# Patient Record
Sex: Male | Born: 1980 | Race: Black or African American | Hispanic: No | Marital: Single | Smoking: Never smoker
Health system: Southern US, Community
[De-identification: ages and names within clinical notes are randomized; demographics above are authoritative.]

## PROBLEM LIST (undated history)

## (undated) DIAGNOSIS — Z21 Asymptomatic human immunodeficiency virus [HIV] infection status: Secondary | ICD-10-CM

## (undated) DIAGNOSIS — B20 Human immunodeficiency virus [HIV] disease: Secondary | ICD-10-CM

---

## 2001-07-24 ENCOUNTER — Emergency Department (HOSPITAL_COMMUNITY): Admission: EM | Admit: 2001-07-24 | Discharge: 2001-07-25 | Payer: Self-pay | Admitting: *Deleted

## 2001-07-29 ENCOUNTER — Emergency Department (HOSPITAL_COMMUNITY): Admission: EM | Admit: 2001-07-29 | Discharge: 2001-07-30 | Payer: Self-pay | Admitting: Emergency Medicine

## 2001-07-30 ENCOUNTER — Ambulatory Visit (HOSPITAL_COMMUNITY): Admission: RE | Admit: 2001-07-30 | Discharge: 2001-07-30 | Payer: Self-pay | Admitting: *Deleted

## 2002-03-27 ENCOUNTER — Encounter (INDEPENDENT_AMBULATORY_CARE_PROVIDER_SITE_OTHER): Payer: Self-pay | Admitting: *Deleted

## 2002-03-27 LAB — CONVERTED CEMR LAB
CD4 Count: 280 microliters
CD4 T Cell Abs: 280

## 2002-09-13 ENCOUNTER — Encounter: Admission: RE | Admit: 2002-09-13 | Discharge: 2002-09-13 | Payer: Self-pay | Admitting: Infectious Diseases

## 2002-09-13 ENCOUNTER — Ambulatory Visit (HOSPITAL_COMMUNITY): Admission: RE | Admit: 2002-09-13 | Discharge: 2002-09-13 | Payer: Self-pay | Admitting: Infectious Diseases

## 2002-11-15 ENCOUNTER — Encounter: Admission: RE | Admit: 2002-11-15 | Discharge: 2002-11-15 | Payer: Self-pay | Admitting: Infectious Diseases

## 2003-04-23 ENCOUNTER — Encounter (INDEPENDENT_AMBULATORY_CARE_PROVIDER_SITE_OTHER): Payer: Self-pay | Admitting: Infectious Diseases

## 2003-04-23 ENCOUNTER — Encounter: Admission: RE | Admit: 2003-04-23 | Discharge: 2003-04-23 | Payer: Self-pay | Admitting: Infectious Diseases

## 2003-04-23 ENCOUNTER — Ambulatory Visit (HOSPITAL_COMMUNITY): Admission: RE | Admit: 2003-04-23 | Discharge: 2003-04-23 | Payer: Self-pay | Admitting: Infectious Diseases

## 2003-05-07 ENCOUNTER — Encounter: Admission: RE | Admit: 2003-05-07 | Discharge: 2003-05-07 | Payer: Self-pay | Admitting: Infectious Diseases

## 2003-09-06 ENCOUNTER — Encounter (INDEPENDENT_AMBULATORY_CARE_PROVIDER_SITE_OTHER): Payer: Self-pay | Admitting: Infectious Diseases

## 2003-09-06 ENCOUNTER — Encounter: Admission: RE | Admit: 2003-09-06 | Discharge: 2003-09-06 | Payer: Self-pay | Admitting: Infectious Diseases

## 2003-09-06 ENCOUNTER — Ambulatory Visit (HOSPITAL_COMMUNITY): Admission: RE | Admit: 2003-09-06 | Discharge: 2003-09-06 | Payer: Self-pay | Admitting: Infectious Diseases

## 2003-09-19 ENCOUNTER — Encounter: Admission: RE | Admit: 2003-09-19 | Discharge: 2003-09-19 | Payer: Self-pay | Admitting: Infectious Diseases

## 2004-01-21 ENCOUNTER — Ambulatory Visit (HOSPITAL_COMMUNITY): Admission: RE | Admit: 2004-01-21 | Discharge: 2004-01-21 | Payer: Self-pay | Admitting: Infectious Diseases

## 2004-01-21 ENCOUNTER — Encounter: Admission: RE | Admit: 2004-01-21 | Discharge: 2004-01-21 | Payer: Self-pay | Admitting: Infectious Diseases

## 2004-02-04 ENCOUNTER — Encounter: Admission: RE | Admit: 2004-02-04 | Discharge: 2004-02-04 | Payer: Self-pay | Admitting: Infectious Diseases

## 2004-08-25 ENCOUNTER — Ambulatory Visit: Payer: Self-pay | Admitting: Infectious Diseases

## 2004-08-25 ENCOUNTER — Ambulatory Visit (HOSPITAL_COMMUNITY): Admission: RE | Admit: 2004-08-25 | Discharge: 2004-08-25 | Payer: Self-pay | Admitting: Infectious Diseases

## 2004-09-08 ENCOUNTER — Ambulatory Visit: Payer: Self-pay | Admitting: Infectious Diseases

## 2004-10-13 ENCOUNTER — Ambulatory Visit: Payer: Self-pay | Admitting: Infectious Diseases

## 2004-10-13 ENCOUNTER — Ambulatory Visit (HOSPITAL_COMMUNITY): Admission: RE | Admit: 2004-10-13 | Discharge: 2004-10-13 | Payer: Self-pay | Admitting: Infectious Diseases

## 2004-11-03 ENCOUNTER — Ambulatory Visit: Payer: Self-pay | Admitting: Infectious Diseases

## 2005-03-04 ENCOUNTER — Ambulatory Visit: Payer: Self-pay | Admitting: Infectious Diseases

## 2005-03-04 ENCOUNTER — Ambulatory Visit (HOSPITAL_COMMUNITY): Admission: RE | Admit: 2005-03-04 | Discharge: 2005-03-04 | Payer: Self-pay | Admitting: Infectious Diseases

## 2005-03-23 ENCOUNTER — Ambulatory Visit: Payer: Self-pay | Admitting: Infectious Diseases

## 2006-02-22 ENCOUNTER — Ambulatory Visit: Payer: Self-pay | Admitting: Infectious Diseases

## 2006-02-22 ENCOUNTER — Encounter: Payer: Self-pay | Admitting: Infectious Disease

## 2006-02-22 ENCOUNTER — Encounter: Admission: RE | Admit: 2006-02-22 | Discharge: 2006-02-22 | Payer: Self-pay | Admitting: Infectious Diseases

## 2006-02-22 LAB — CONVERTED CEMR LAB
CD4 Count: 200 microliters
HIV 1 RNA Quant: 4370 copies/mL

## 2006-03-04 ENCOUNTER — Ambulatory Visit: Payer: Self-pay | Admitting: Infectious Diseases

## 2006-03-22 ENCOUNTER — Ambulatory Visit: Payer: Self-pay | Admitting: Infectious Diseases

## 2006-03-22 ENCOUNTER — Encounter (INDEPENDENT_AMBULATORY_CARE_PROVIDER_SITE_OTHER): Payer: Self-pay | Admitting: *Deleted

## 2006-03-22 ENCOUNTER — Encounter: Admission: RE | Admit: 2006-03-22 | Discharge: 2006-03-22 | Payer: Self-pay | Admitting: Infectious Diseases

## 2006-03-22 LAB — CONVERTED CEMR LAB
CD4 Count: 240 microliters
HIV 1 RNA Quant: 18300 copies/mL

## 2006-07-08 ENCOUNTER — Ambulatory Visit: Payer: Self-pay | Admitting: Infectious Diseases

## 2006-07-08 ENCOUNTER — Encounter (INDEPENDENT_AMBULATORY_CARE_PROVIDER_SITE_OTHER): Payer: Self-pay | Admitting: *Deleted

## 2006-07-08 ENCOUNTER — Encounter: Admission: RE | Admit: 2006-07-08 | Discharge: 2006-07-08 | Payer: Self-pay | Admitting: Infectious Diseases

## 2006-07-08 LAB — CONVERTED CEMR LAB
CD4 Count: 330 microliters
HIV 1 RNA Quant: 4480 copies/mL

## 2006-07-09 DIAGNOSIS — B20 Human immunodeficiency virus [HIV] disease: Secondary | ICD-10-CM | POA: Insufficient documentation

## 2006-07-22 ENCOUNTER — Ambulatory Visit: Payer: Self-pay | Admitting: Infectious Diseases

## 2006-11-08 ENCOUNTER — Encounter (INDEPENDENT_AMBULATORY_CARE_PROVIDER_SITE_OTHER): Payer: Self-pay | Admitting: *Deleted

## 2006-11-08 ENCOUNTER — Encounter: Admission: RE | Admit: 2006-11-08 | Discharge: 2006-11-08 | Payer: Self-pay | Admitting: Infectious Diseases

## 2006-11-08 ENCOUNTER — Ambulatory Visit: Payer: Self-pay | Admitting: Infectious Diseases

## 2006-11-08 LAB — CONVERTED CEMR LAB
CD4 Count: 340 microliters
HIV 1 RNA Quant: 8460 copies/mL

## 2006-11-29 ENCOUNTER — Encounter (INDEPENDENT_AMBULATORY_CARE_PROVIDER_SITE_OTHER): Payer: Self-pay | Admitting: *Deleted

## 2006-11-29 LAB — CONVERTED CEMR LAB

## 2006-12-12 ENCOUNTER — Encounter (INDEPENDENT_AMBULATORY_CARE_PROVIDER_SITE_OTHER): Payer: Self-pay | Admitting: *Deleted

## 2006-12-20 ENCOUNTER — Ambulatory Visit: Payer: Self-pay | Admitting: Infectious Diseases

## 2006-12-20 DIAGNOSIS — F329 Major depressive disorder, single episode, unspecified: Secondary | ICD-10-CM

## 2007-04-25 ENCOUNTER — Other Ambulatory Visit (HOSPITAL_COMMUNITY): Admission: RE | Admit: 2007-04-25 | Discharge: 2007-07-24 | Payer: Self-pay | Admitting: Psychiatry

## 2007-04-28 ENCOUNTER — Ambulatory Visit: Payer: Self-pay | Admitting: Psychiatry

## 2007-05-16 ENCOUNTER — Ambulatory Visit: Payer: Self-pay | Admitting: Infectious Disease

## 2007-05-16 ENCOUNTER — Encounter (INDEPENDENT_AMBULATORY_CARE_PROVIDER_SITE_OTHER): Payer: Self-pay | Admitting: *Deleted

## 2007-05-16 ENCOUNTER — Encounter: Admission: RE | Admit: 2007-05-16 | Discharge: 2007-05-16 | Payer: Self-pay | Admitting: Infectious Disease

## 2007-05-16 LAB — CONVERTED CEMR LAB
BUN: 12 mg/dL (ref 6–23)
CO2: 25 meq/L (ref 19–32)
Creatinine, Ser: 1.2 mg/dL (ref 0.40–1.50)
Eosinophils Relative: 4 % (ref 0–5)
Glucose, Bld: 78 mg/dL (ref 70–99)
HCT: 42.3 % (ref 39.0–52.0)
HIV 1 RNA Quant: 5340 copies/mL — ABNORMAL HIGH (ref <50)
HIV-1 RNA Quant, Log: 3.73 — ABNORMAL HIGH (ref <1.70)
Hemoglobin: 14.4 g/dL (ref 13.0–17.0)
Lymphocytes Relative: 51 % — ABNORMAL HIGH (ref 12–46)
Lymphs Abs: 1 10*3/uL (ref 0.7–3.3)
Monocytes Absolute: 0.3 10*3/uL (ref 0.2–0.7)
Total Bilirubin: 0.5 mg/dL (ref 0.3–1.2)
WBC: 1.9 10*3/uL — ABNORMAL LOW (ref 4.0–10.5)

## 2007-05-30 ENCOUNTER — Encounter (INDEPENDENT_AMBULATORY_CARE_PROVIDER_SITE_OTHER): Payer: Self-pay | Admitting: *Deleted

## 2007-05-30 ENCOUNTER — Ambulatory Visit: Payer: Self-pay | Admitting: Infectious Disease

## 2007-05-30 DIAGNOSIS — B002 Herpesviral gingivostomatitis and pharyngotonsillitis: Secondary | ICD-10-CM | POA: Insufficient documentation

## 2007-05-30 DIAGNOSIS — B957 Other staphylococcus as the cause of diseases classified elsewhere: Secondary | ICD-10-CM | POA: Insufficient documentation

## 2007-06-14 ENCOUNTER — Telehealth: Payer: Self-pay | Admitting: Infectious Disease

## 2007-08-26 ENCOUNTER — Ambulatory Visit: Payer: Self-pay | Admitting: Infectious Disease

## 2007-08-26 ENCOUNTER — Encounter: Admission: RE | Admit: 2007-08-26 | Discharge: 2007-08-26 | Payer: Self-pay | Admitting: Infectious Disease

## 2007-08-26 LAB — CONVERTED CEMR LAB
AST: 32 units/L (ref 0–37)
Albumin: 4.5 g/dL (ref 3.5–5.2)
BUN: 13 mg/dL (ref 6–23)
Basophils Relative: 0 % (ref 0–1)
Calcium: 9.6 mg/dL (ref 8.4–10.5)
Chloride: 102 meq/L (ref 96–112)
Lymphs Abs: 1.3 10*3/uL (ref 0.7–4.0)
MCHC: 35.3 g/dL (ref 30.0–36.0)
Monocytes Relative: 14 % — ABNORMAL HIGH (ref 3–12)
Neutro Abs: 0.6 10*3/uL — ABNORMAL LOW (ref 1.7–7.7)
Neutrophils Relative %: 26 % — ABNORMAL LOW (ref 43–77)
Potassium: 4.1 meq/L (ref 3.5–5.3)
RBC: 4.66 M/uL (ref 4.22–5.81)
WBC: 2.3 10*3/uL — ABNORMAL LOW (ref 4.0–10.5)

## 2007-09-19 ENCOUNTER — Ambulatory Visit: Payer: Self-pay | Admitting: Infectious Disease

## 2007-09-19 LAB — CONVERTED CEMR LAB

## 2007-10-04 ENCOUNTER — Encounter (INDEPENDENT_AMBULATORY_CARE_PROVIDER_SITE_OTHER): Payer: Self-pay | Admitting: *Deleted

## 2007-10-13 ENCOUNTER — Telehealth: Payer: Self-pay | Admitting: Infectious Disease

## 2007-11-22 ENCOUNTER — Encounter: Admission: RE | Admit: 2007-11-22 | Discharge: 2007-11-22 | Payer: Self-pay | Admitting: Infectious Disease

## 2007-11-22 ENCOUNTER — Ambulatory Visit: Payer: Self-pay | Admitting: Infectious Disease

## 2007-11-22 LAB — CONVERTED CEMR LAB
ALT: 35 units/L (ref 0–53)
Alkaline Phosphatase: 110 units/L (ref 39–117)
Basophils Absolute: 0 10*3/uL (ref 0.0–0.1)
Basophils Relative: 1 % (ref 0–1)
Eosinophils Absolute: 0.1 10*3/uL (ref 0.0–0.7)
Eosinophils Relative: 5 % (ref 0–5)
HCT: 44.1 % (ref 39.0–52.0)
Lymphocytes Relative: 49 % — ABNORMAL HIGH (ref 12–46)
MCHC: 34 g/dL (ref 30.0–36.0)
MCV: 92.8 fL (ref 78.0–100.0)
Platelets: 225 10*3/uL (ref 150–400)
RDW: 13.7 % (ref 11.5–15.5)
Sodium: 139 meq/L (ref 135–145)
Total Bilirubin: 0.5 mg/dL (ref 0.3–1.2)
Total Protein: 8.5 g/dL — ABNORMAL HIGH (ref 6.0–8.3)

## 2007-12-12 ENCOUNTER — Ambulatory Visit: Payer: Self-pay | Admitting: Infectious Disease

## 2007-12-12 DIAGNOSIS — L988 Other specified disorders of the skin and subcutaneous tissue: Secondary | ICD-10-CM | POA: Insufficient documentation

## 2007-12-12 DIAGNOSIS — B354 Tinea corporis: Secondary | ICD-10-CM | POA: Insufficient documentation

## 2008-04-09 ENCOUNTER — Telehealth: Payer: Self-pay | Admitting: Infectious Disease

## 2008-04-09 ENCOUNTER — Encounter (INDEPENDENT_AMBULATORY_CARE_PROVIDER_SITE_OTHER): Payer: Self-pay | Admitting: *Deleted

## 2008-04-13 ENCOUNTER — Ambulatory Visit: Payer: Self-pay | Admitting: Infectious Disease

## 2008-04-13 ENCOUNTER — Encounter: Admission: RE | Admit: 2008-04-13 | Discharge: 2008-04-13 | Payer: Self-pay | Admitting: Infectious Disease

## 2008-04-13 LAB — CONVERTED CEMR LAB
AST: 20 units/L (ref 0–37)
Albumin: 4.2 g/dL (ref 3.5–5.2)
Alkaline Phosphatase: 109 units/L (ref 39–117)
Chloride: 107 meq/L (ref 96–112)
Eosinophils Absolute: 0.2 10*3/uL (ref 0.0–0.7)
Eosinophils Relative: 8 % — ABNORMAL HIGH (ref 0–5)
Glucose, Bld: 102 mg/dL — ABNORMAL HIGH (ref 70–99)
HCT: 44.3 % (ref 39.0–52.0)
HIV 1 RNA Quant: 106 copies/mL — ABNORMAL HIGH (ref <50)
Hemoglobin: 15.2 g/dL (ref 13.0–17.0)
Lymphocytes Relative: 48 % — ABNORMAL HIGH (ref 12–46)
MCHC: 34.3 g/dL (ref 30.0–36.0)
Monocytes Absolute: 0.2 10*3/uL (ref 0.1–1.0)
Monocytes Relative: 10 % (ref 3–12)
Potassium: 4.3 meq/L (ref 3.5–5.3)
RBC: 4.63 M/uL (ref 4.22–5.81)
RDW: 13.1 % (ref 11.5–15.5)
Sodium: 140 meq/L (ref 135–145)
Total Protein: 7.8 g/dL (ref 6.0–8.3)

## 2008-04-18 ENCOUNTER — Encounter (INDEPENDENT_AMBULATORY_CARE_PROVIDER_SITE_OTHER): Payer: Self-pay | Admitting: *Deleted

## 2008-04-23 ENCOUNTER — Ambulatory Visit: Payer: Self-pay | Admitting: Infectious Disease

## 2008-04-23 DIAGNOSIS — R1110 Vomiting, unspecified: Secondary | ICD-10-CM

## 2008-04-23 LAB — CONVERTED CEMR LAB
AST: 28 units/L (ref 0–37)
Albumin: 4.4 g/dL (ref 3.5–5.2)
Alkaline Phosphatase: 117 units/L (ref 39–117)
BUN: 14 mg/dL (ref 6–23)
Basophils Relative: 0 % (ref 0–1)
Bilirubin Urine: NEGATIVE
Creatinine, Ser: 1.06 mg/dL (ref 0.40–1.50)
Eosinophils Absolute: 0.2 10*3/uL (ref 0.0–0.7)
Eosinophils Relative: 6 % — ABNORMAL HIGH (ref 0–5)
Glucose, Bld: 61 mg/dL — ABNORMAL LOW (ref 70–99)
HCT: 45.8 % (ref 39.0–52.0)
Hemoglobin: 16 g/dL (ref 13.0–17.0)
Lymphs Abs: 1.3 10*3/uL (ref 0.7–4.0)
MCHC: 34.9 g/dL (ref 30.0–36.0)
MCV: 94.4 fL (ref 78.0–100.0)
Monocytes Absolute: 0.3 10*3/uL (ref 0.1–1.0)
Monocytes Relative: 11 % (ref 3–12)
Neutrophils Relative %: 35 % — ABNORMAL LOW (ref 43–77)
Nitrite: NEGATIVE
Potassium: 4.3 meq/L (ref 3.5–5.3)
RBC: 4.85 M/uL (ref 4.22–5.81)
Specific Gravity, Urine: 1.034 — ABNORMAL HIGH (ref 1.005–1.03)
Urobilinogen, UA: 1 (ref 0.0–1.0)
WBC: 2.8 10*3/uL — ABNORMAL LOW (ref 4.0–10.5)
pH: 6 (ref 5.0–8.0)

## 2008-04-25 ENCOUNTER — Ambulatory Visit (HOSPITAL_COMMUNITY): Admission: RE | Admit: 2008-04-25 | Discharge: 2008-04-25 | Payer: Self-pay | Admitting: Infectious Disease

## 2008-05-18 ENCOUNTER — Encounter: Payer: Self-pay | Admitting: Infectious Disease

## 2008-06-06 ENCOUNTER — Ambulatory Visit: Payer: Self-pay | Admitting: Infectious Disease

## 2008-06-06 ENCOUNTER — Encounter (INDEPENDENT_AMBULATORY_CARE_PROVIDER_SITE_OTHER): Payer: Self-pay | Admitting: *Deleted

## 2008-06-06 LAB — CONVERTED CEMR LAB
ALT: 32 units/L (ref 0–53)
Albumin: 4.4 g/dL (ref 3.5–5.2)
Basophils Absolute: 0 10*3/uL (ref 0.0–0.1)
CO2: 24 meq/L (ref 19–32)
Calcium: 9 mg/dL (ref 8.4–10.5)
Chloride: 107 meq/L (ref 96–112)
Cholesterol: 195 mg/dL (ref 0–200)
HIV-1 RNA Quant, Log: 2.05 — ABNORMAL HIGH (ref <1.70)
Hemoglobin: 15.2 g/dL (ref 13.0–17.0)
Lymphocytes Relative: 54 % — ABNORMAL HIGH (ref 12–46)
Lymphs Abs: 1.5 10*3/uL (ref 0.7–4.0)
Neutro Abs: 0.9 10*3/uL — ABNORMAL LOW (ref 1.7–7.7)
Platelets: 243 10*3/uL (ref 150–400)
Potassium: 3.8 meq/L (ref 3.5–5.3)
RDW: 13.5 % (ref 11.5–15.5)
Sodium: 140 meq/L (ref 135–145)
Total Protein: 8 g/dL (ref 6.0–8.3)
VLDL: 27 mg/dL (ref 0–40)
WBC: 2.9 10*3/uL — ABNORMAL LOW (ref 4.0–10.5)

## 2008-07-04 ENCOUNTER — Ambulatory Visit: Payer: Self-pay | Admitting: Infectious Disease

## 2008-08-16 ENCOUNTER — Ambulatory Visit: Payer: Self-pay | Admitting: Infectious Disease

## 2008-08-16 DIAGNOSIS — I10 Essential (primary) hypertension: Secondary | ICD-10-CM | POA: Insufficient documentation

## 2008-08-16 LAB — CONVERTED CEMR LAB
Creatinine, Urine: 237.6 mg/dL
Microalb Creat Ratio: 2.4 mg/g (ref 0.0–30.0)
Microalb, Ur: 0.56 mg/dL (ref 0.00–1.89)

## 2008-12-11 ENCOUNTER — Ambulatory Visit: Payer: Self-pay | Admitting: Infectious Disease

## 2008-12-11 LAB — CONVERTED CEMR LAB
ALT: 57 U/L — ABNORMAL HIGH
AST: 30 U/L
Albumin: 4.3 g/dL
Alkaline Phosphatase: 106 U/L
BUN: 13 mg/dL
Band Neutrophils: 0 %
Basophils Absolute: 0 10*3/uL
Basophils Relative: 0 %
CO2: 25 meq/L
Calcium: 9.1 mg/dL
Chloride: 103 meq/L
Creatinine, Ser: 1.1 mg/dL
Eosinophils Absolute: 0.1 10*3/uL
Eosinophils Relative: 3 %
Glucose, Bld: 92 mg/dL
HCT: 42.6 %
Hemoglobin: 14.7 g/dL
Lymphocytes Relative: 49 % — ABNORMAL HIGH
Lymphs Abs: 1.7 10*3/uL
MCHC: 34.5 g/dL
MCV: 96.6 fL
Monocytes Absolute: 0.4 10*3/uL
Monocytes Relative: 11 %
Neutro Abs: 1.3 10*3/uL — ABNORMAL LOW
Neutrophils Relative %: 37 % — ABNORMAL LOW
Platelets: 237 10*3/uL
Potassium: 4.1 meq/L
RBC: 4.41 M/uL
RDW: 12.6 %
Sodium: 138 meq/L
Total Bilirubin: 0.5 mg/dL
Total Protein: 7.8 g/dL
WBC: 3.5 10*3/uL — ABNORMAL LOW

## 2008-12-24 ENCOUNTER — Ambulatory Visit: Payer: Self-pay | Admitting: Infectious Disease

## 2008-12-24 LAB — CONVERTED CEMR LAB
Chlamydia, Swab/Urine, PCR: NEGATIVE
GC Probe Amp, Urine: NEGATIVE
HIV 1 RNA Quant: <48 copies/mL (ref <48)
HIV-1 RNA Quant, Log: <1.68 (ref <1.68)

## 2009-01-11 ENCOUNTER — Telehealth: Payer: Self-pay | Admitting: Infectious Disease

## 2009-01-15 ENCOUNTER — Encounter (INDEPENDENT_AMBULATORY_CARE_PROVIDER_SITE_OTHER): Payer: Self-pay | Admitting: *Deleted

## 2009-02-25 ENCOUNTER — Ambulatory Visit: Payer: Self-pay | Admitting: Infectious Disease

## 2009-02-25 LAB — CONVERTED CEMR LAB
ALT: 63 units/L — ABNORMAL HIGH (ref 0–53)
AST: 38 units/L — ABNORMAL HIGH (ref 0–37)
Alkaline Phosphatase: 134 units/L — ABNORMAL HIGH (ref 39–117)
Basophils Relative: 1 % (ref 0–1)
Cholesterol: 184 mg/dL (ref 0–200)
Creatinine, Ser: 1.14 mg/dL (ref 0.40–1.50)
Eosinophils Absolute: 0.3 10*3/uL (ref 0.0–0.7)
HDL: 42 mg/dL (ref >39)
HIV-1 RNA Quant, Log: 2.06 — ABNORMAL HIGH (ref <1.68)
Hemoglobin: 15.8 g/dL (ref 13.0–17.0)
MCHC: 35.7 g/dL (ref 30.0–36.0)
MCV: 94.8 fL (ref 78.0–100.0)
Monocytes Absolute: 0.3 10*3/uL (ref 0.1–1.0)
Monocytes Relative: 10 % (ref 3–12)
Neutro Abs: 0.5 10*3/uL — ABNORMAL LOW (ref 1.7–7.7)
RBC: 4.66 M/uL (ref 4.22–5.81)
Total Bilirubin: 0.4 mg/dL (ref 0.3–1.2)
Total CHOL/HDL Ratio: 4.4
VLDL: 25 mg/dL (ref 0–40)

## 2009-03-08 ENCOUNTER — Ambulatory Visit: Payer: Self-pay | Admitting: Infectious Disease

## 2009-03-08 LAB — CONVERTED CEMR LAB
CO2: 25 meq/L (ref 19–32)
Calcium: 9.3 mg/dL (ref 8.4–10.5)
Potassium: 4.5 meq/L (ref 3.5–5.3)
Sodium: 141 meq/L (ref 135–145)

## 2009-04-21 IMAGING — US US ABDOMEN COMPLETE
1 series · 14 of 25 positions shown · non-contrast
Comparison: None.

CLINICAL DATA: 27-year-old male bilious vomiting and nausea

ABDOMEN ULTRASOUND
TECHNIQUE: Complete abdominal ultrasound examination was performed
including evaluation of the liver, gallbladder, bile ducts,
pancreas, kidneys, spleen, IVC, and abdominal aorta.

[Series 1: unknown · 0.32mm/px · 14 of 67 slices shown]
[im 1/67]
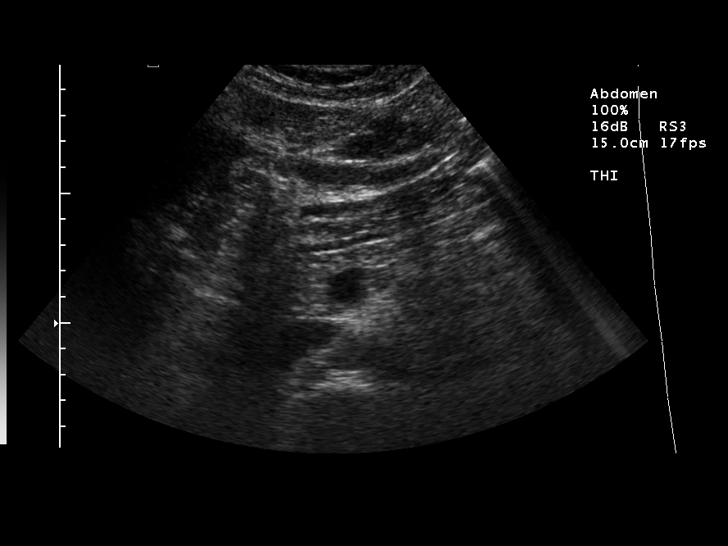
[im 6/67]
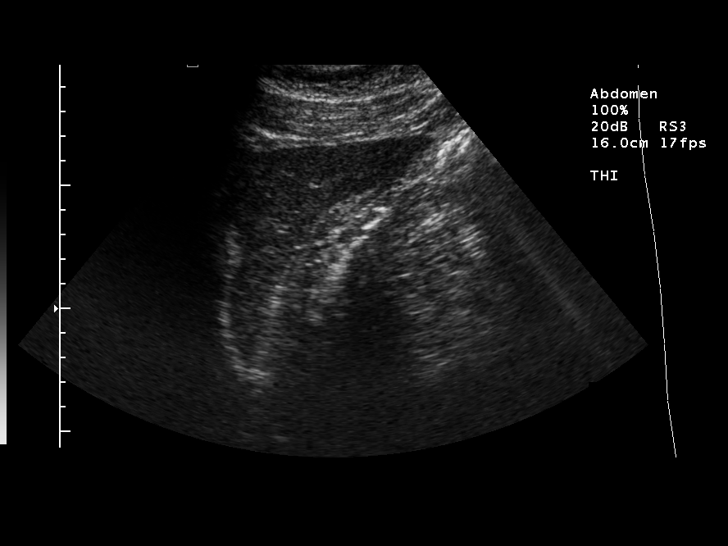
[im 12/67]
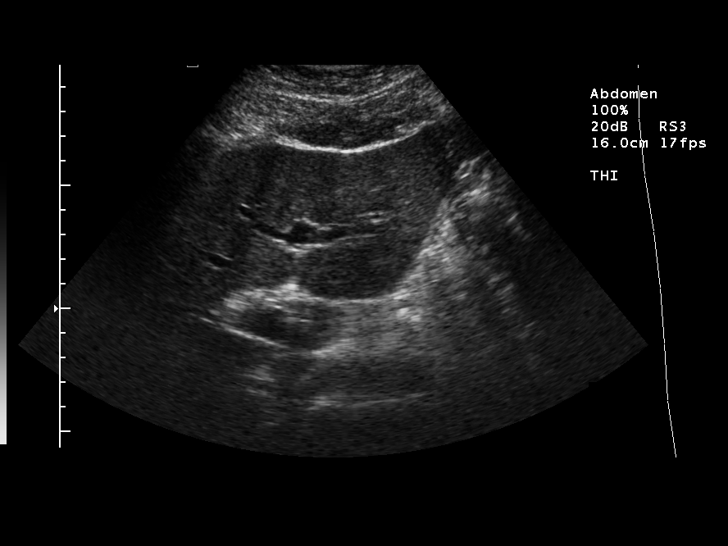
[im 17/67]
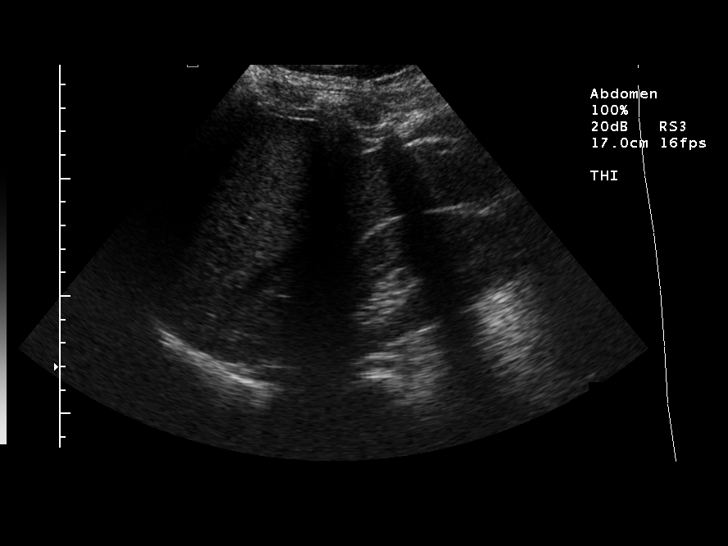
[im 23/67]
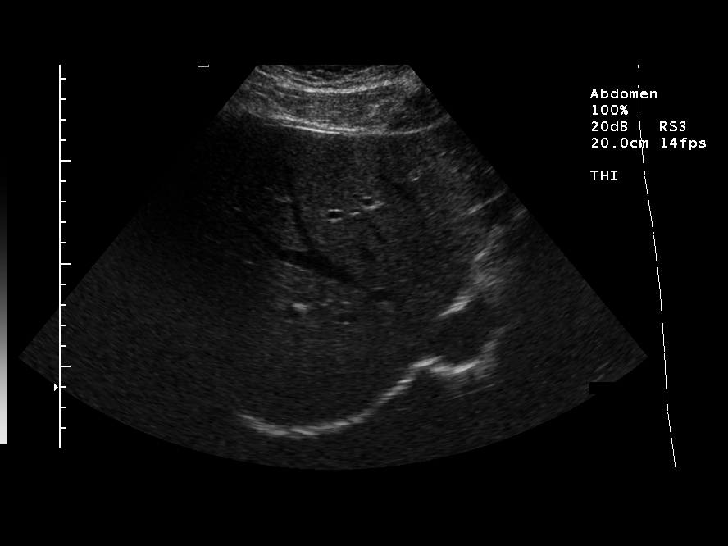
[im 25/67]
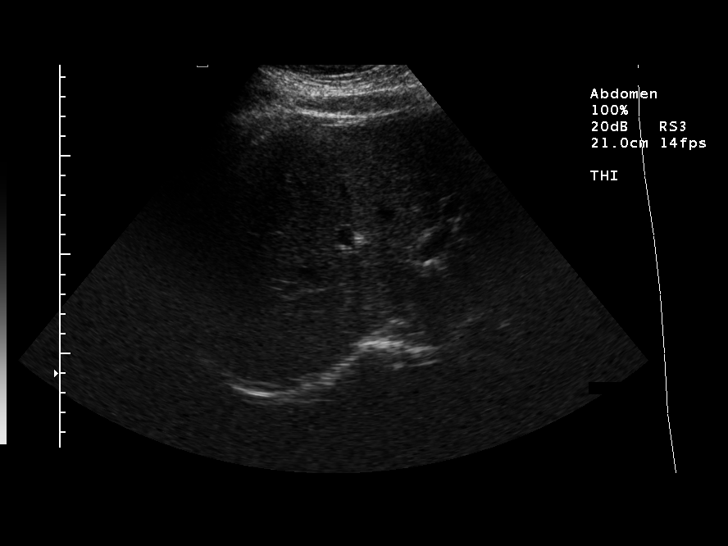
[im 31/67]
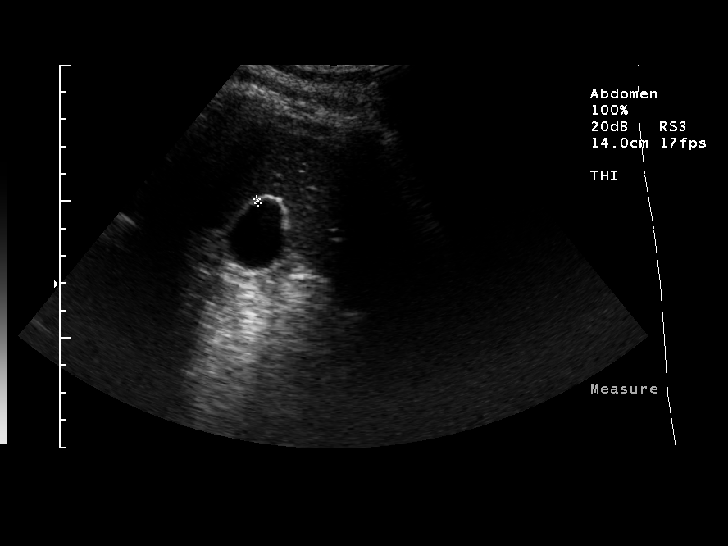
[im 36/67]
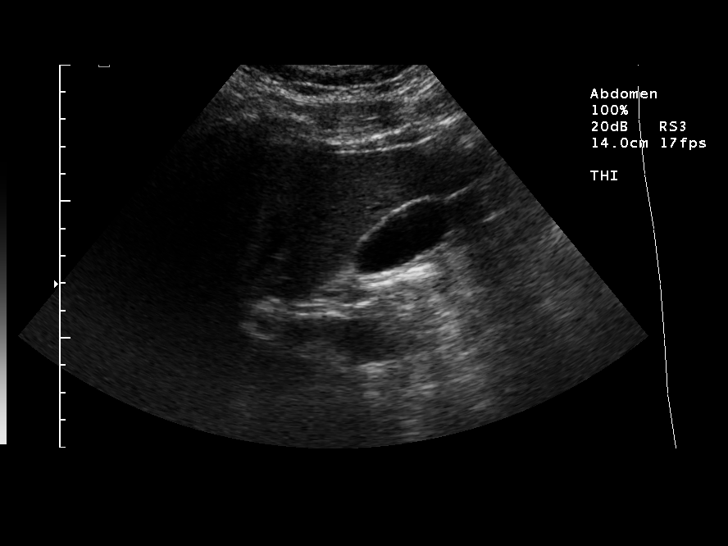
[im 42/67]
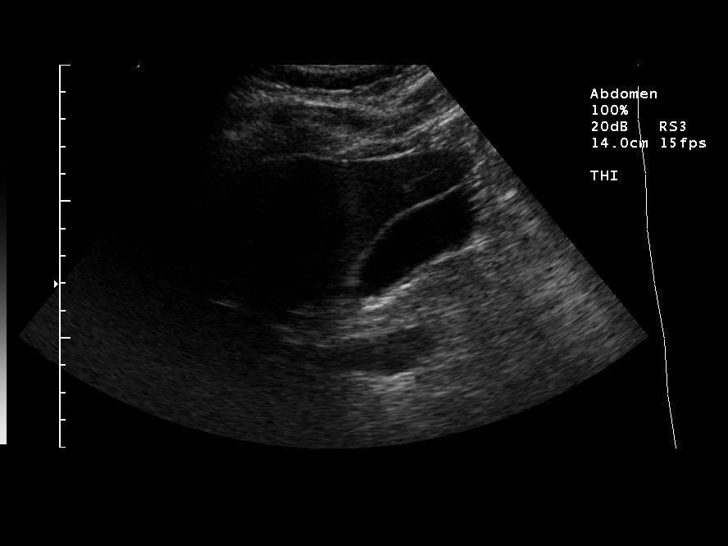
[im 45/67]
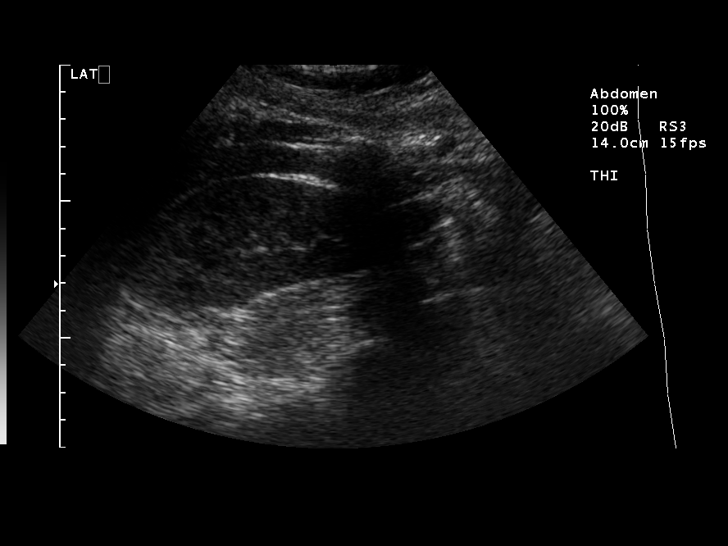
[im 50/67]
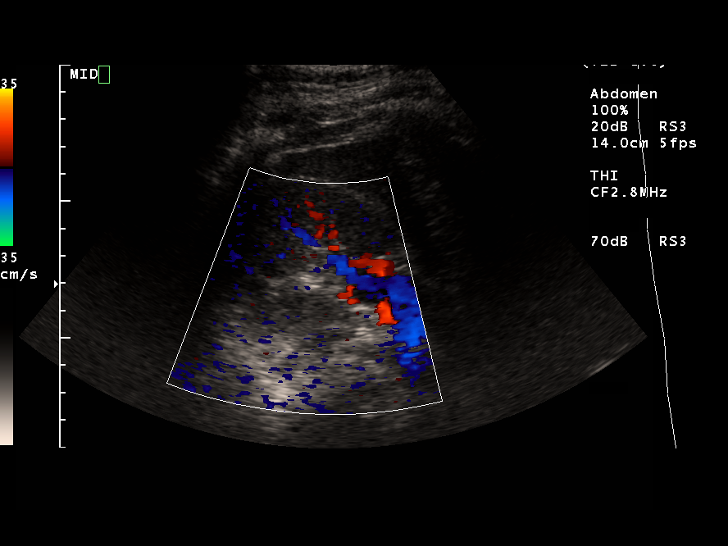
[im 56/67]
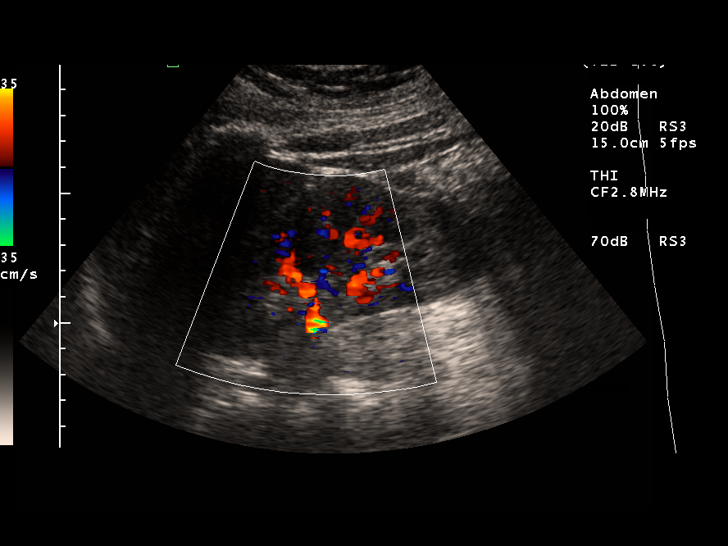
[im 61/67]
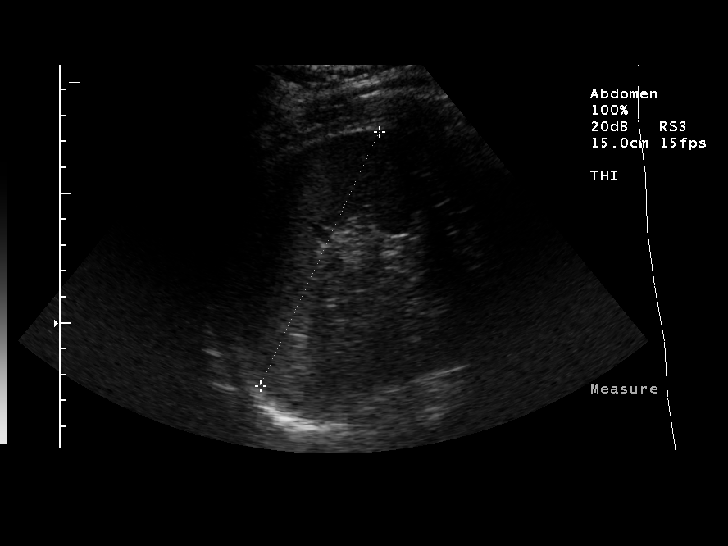
[im 67/67]
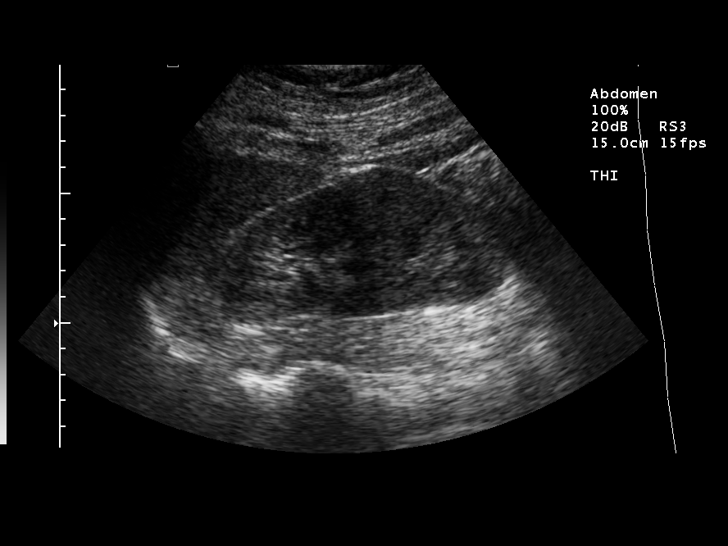

[14 of 25 positions shown; findings below may reference images not displayed]

FINDINGS: The gallbladder is normal in appearance without
cholelithiasis, pericholecystic fluid, or a sonographic Murphy's
sign.  The gallbladder wall thickness is normal measuring 1.6 mm.
The common bile duct is not dilated measuring 2.8 mm.  The imaged
portions of the liver, IVC, spleen, kidneys, and aorta are normal
for age.  The pancreas is not well visualized.  No abdominal free
fluid or fluid collection.

The spleen measures 10.8 cm.

The right kidney measures 11.2 cm and the left kidney measures
cm.  No renal obstruction, hydronephrosis, renal atrophy or
perinephric fluid collection.

Abdominal aorta has a maximal diameter 1.7 cm.  No aneurysm.
IMPRESSION: No acute finding by ultrasound.

## 2009-04-30 ENCOUNTER — Telehealth (INDEPENDENT_AMBULATORY_CARE_PROVIDER_SITE_OTHER): Payer: Self-pay | Admitting: *Deleted

## 2009-06-19 ENCOUNTER — Encounter: Payer: Self-pay | Admitting: Infectious Disease

## 2009-06-26 ENCOUNTER — Encounter: Payer: Self-pay | Admitting: Infectious Disease

## 2009-11-20 ENCOUNTER — Encounter: Payer: Self-pay | Admitting: Infectious Disease

## 2010-01-14 ENCOUNTER — Encounter: Payer: Self-pay | Admitting: Infectious Disease

## 2010-11-02 LAB — CONVERTED CEMR LAB
ALT: 43 units/L (ref 0–53)
AST: 30 units/L (ref 0–37)
Albumin: 4 g/dL (ref 3.5–5.2)
Alkaline Phosphatase: 76 units/L (ref 39–117)
BUN: 13 mg/dL (ref 6–23)
Basophils Absolute: 0 10*3/uL (ref 0.0–0.1)
Basophils Relative: 1 % (ref 0–1)
Bilirubin Urine: NEGATIVE
CO2: 24 meq/L (ref 19–32)
Calcium: 9.5 mg/dL (ref 8.4–10.5)
Chloride: 107 meq/L (ref 96–112)
Creatinine, Ser: 0.98 mg/dL (ref 0.40–1.50)
Eosinophils Absolute: 0.1 10*3/uL (ref 0.0–0.7)
Eosinophils Relative: 4 % (ref 0–5)
Glucose, Bld: 84 mg/dL (ref 70–99)
HCT: 42.8 % (ref 39.0–52.0)
HIV 1 RNA Quant: 8460 copies/mL — ABNORMAL HIGH (ref <50)
HIV-1 RNA Quant, Log: 3.93 — ABNORMAL HIGH (ref <1.70)
Hemoglobin, Urine: NEGATIVE
Hemoglobin: 14.3 g/dL (ref 13.0–17.0)
Ketones, ur: NEGATIVE mg/dL
Leukocytes, UA: NEGATIVE
Lymphocytes Relative: 52 % — ABNORMAL HIGH (ref 12–46)
Lymphs Abs: 1.3 10*3/uL (ref 0.7–3.3)
MCHC: 33.4 g/dL (ref 30.0–36.0)
MCV: 93.4 fL (ref 78.0–100.0)
Monocytes Absolute: 0.4 10*3/uL (ref 0.2–0.7)
Monocytes Relative: 14 % — ABNORMAL HIGH (ref 3–11)
Neutro Abs: 0.7 10*3/uL — ABNORMAL LOW (ref 1.7–7.7)
Neutrophils Relative %: 29 % — ABNORMAL LOW (ref 43–77)
Nitrite: NEGATIVE
Platelets: 235 10*3/uL (ref 150–400)
Potassium: 4.7 meq/L (ref 3.5–5.3)
Protein, ur: NEGATIVE mg/dL
RBC: 4.58 M/uL (ref 4.22–5.81)
RDW: 13.7 % (ref 11.5–14.0)
Sodium: 143 meq/L (ref 135–145)
Specific Gravity, Urine: 1.019 (ref 1.005–1.03)
Total Bilirubin: 0.3 mg/dL (ref 0.3–1.2)
Total Protein: 8.2 g/dL (ref 6.0–8.3)
Urine Glucose: NEGATIVE mg/dL
Urobilinogen, UA: 0.2 (ref 0.0–1.0)
WBC: 2.5 10*3/uL — ABNORMAL LOW (ref 4.0–10.5)
pH: 6 (ref 5.0–8.0)

## 2010-11-04 NOTE — Miscellaneous (Signed)
Summary: [facility]   Imported By: Margie Billet 01/14/2010 11:20:49  _____________________________________________________________________  External Attachment:    Type:   Image     Comment:   External Document

## 2011-01-13 LAB — T-HELPER CELL (CD4) - (RCID CLINIC ONLY)
CD4 % Helper T Cell: 37 % (ref 33–55)
CD4 T Cell Abs: 550 uL (ref 400–2700)

## 2011-01-15 LAB — T-HELPER CELL (CD4) - (RCID CLINIC ONLY): CD4 T Cell Abs: 570 uL (ref 400–2700)

## 2011-06-26 LAB — T-HELPER CELL (CD4) - (RCID CLINIC ONLY)
CD4 % Helper T Cell: 29 — ABNORMAL LOW
CD4 T Cell Abs: 310 — ABNORMAL LOW

## 2011-07-14 LAB — T-HELPER CELL (CD4) - (RCID CLINIC ONLY): CD4 % Helper T Cell: 23 — ABNORMAL LOW

## 2011-07-16 LAB — URINE DRUGS OF ABUSE SCREEN W ALC, ROUTINE (REF LAB)
Amphetamine Screen, Ur: NEGATIVE
Ethyl Alcohol: <5
Marijuana Metabolite: POSITIVE — AB
Opiate Screen, Urine: NEGATIVE

## 2011-07-16 LAB — THC (MARIJUANA), URINE, CONFIRMATION: Marijuana, Ur-Confirmation: 32 ng/mL

## 2011-07-17 LAB — URINE DRUGS OF ABUSE SCREEN W ALC, ROUTINE (REF LAB)
Amphetamine Screen, Ur: NEGATIVE
Amphetamine Screen, Ur: NEGATIVE
Benzodiazepines.: NEGATIVE
Cocaine Metabolites: NEGATIVE
Creatinine,U: 168.2
Creatinine,U: 272.7
Ethyl Alcohol: <5
Marijuana Metabolite: NEGATIVE
Opiate Screen, Urine: NEGATIVE
Opiate Screen, Urine: NEGATIVE
Propoxyphene: NEGATIVE

## 2011-07-17 LAB — THC (MARIJUANA), URINE, CONFIRMATION: Marijuana, Ur-Confirmation: 48 ng/mL

## 2011-07-20 LAB — URINE DRUGS OF ABUSE SCREEN W ALC, ROUTINE (REF LAB)
Barbiturate Quant, Ur: NEGATIVE
Benzodiazepines.: NEGATIVE
Cocaine Metabolites: NEGATIVE
Ethyl Alcohol: <5
Ethyl Alcohol: <5
Marijuana Metabolite: POSITIVE — AB
Marijuana Metabolite: POSITIVE — AB
Opiate Screen, Urine: NEGATIVE
Phencyclidine (PCP): NEGATIVE
Phencyclidine (PCP): NEGATIVE

## 2011-07-20 LAB — T-HELPER CELL (CD4) - (RCID CLINIC ONLY): CD4 % Helper T Cell: 27 — ABNORMAL LOW

## 2011-07-20 LAB — THC (MARIJUANA), URINE, CONFIRMATION: Marijuana, Ur-Confirmation: 120 ng/mL

## 2014-02-04 ENCOUNTER — Emergency Department (INDEPENDENT_AMBULATORY_CARE_PROVIDER_SITE_OTHER): Payer: 59

## 2014-02-04 ENCOUNTER — Encounter (HOSPITAL_COMMUNITY): Payer: Self-pay | Admitting: Emergency Medicine

## 2014-02-04 ENCOUNTER — Emergency Department (HOSPITAL_COMMUNITY)
Admission: EM | Admit: 2014-02-04 | Discharge: 2014-02-04 | Disposition: A | Discharge status: Home / Self Care | Payer: 59 | Source: Home / Self Care

## 2014-02-04 DIAGNOSIS — L02619 Cutaneous abscess of unspecified foot: Secondary | ICD-10-CM

## 2014-02-04 DIAGNOSIS — L03119 Cellulitis of unspecified part of limb: Secondary | ICD-10-CM

## 2014-02-04 DIAGNOSIS — L02612 Cutaneous abscess of left foot: Secondary | ICD-10-CM

## 2014-02-04 HISTORY — DX: Human immunodeficiency virus (HIV) disease: B20

## 2014-02-04 HISTORY — DX: Asymptomatic human immunodeficiency virus (hiv) infection status: Z21

## 2014-02-04 MED ORDER — DOXYCYCLINE HYCLATE 100 MG PO CAPS: 100.0000 mg | ORAL_CAPSULE | Freq: Two times a day (BID) | ORAL | Status: AC

## 2014-02-04 MED ORDER — CEPHALEXIN 250 MG PO CAPS: 250.0000 mg | ORAL_CAPSULE | Freq: Four times a day (QID) | ORAL | Status: AC

## 2014-02-04 MED ORDER — HYDROCODONE-ACETAMINOPHEN 5-325 MG PO TABS: 1.0000 | ORAL_TABLET | ORAL | Status: AC | PRN

## 2014-02-04 NOTE — ED Provider Notes (Signed)
CSN: [id number]     Arrival date & time 02/04/14  0902 History   None    Chief Complaint  Patient presents with  . Foot Pain   (Consider location/radiation/quality/duration/timing/severity/associated sxs/prior Treatment)  HPI  The patient is a 33 year old male reports history of HIV-positive status and report he stepped on a rusty hammer approximately 2 months ago.  At that time he received a tetanus shot, but states he did not receive any antibiotic treatment or x-rays at that time.  Patient states it is painful to the touch and difficult to walk on. States he started doxycycline 3 times a day 3 days ago with a "left over" antibiotic.  Past Medical History  Diagnosis Date  . HIV (human immunodeficiency virus infection)    History reviewed. No pertinent past surgical history. History reviewed. No pertinent family history. History  Substance Use Topics  . Smoking status: Never Smoker   . Smokeless tobacco: Not on file  . Alcohol Use: No    Review of Systems  Constitutional: Negative.  Negative for fever, chills and fatigue.  HENT: Negative.   Eyes: Negative.   Respiratory: Negative.   Cardiovascular: Negative.   Gastrointestinal: Negative.   Endocrine: Negative.   Genitourinary: Negative.   Musculoskeletal: Negative.   Skin: Positive for wound.       Bottom of L foot.  Allergic/Immunologic: Positive for immunocompromised state.  Neurological: Negative.   Hematological: Negative.   Psychiatric/Behavioral: Negative.     Allergies  Review of patient's allergies indicates no known allergies.  Home Medications   Prior to Admission medications   Not on File   BP 152/90  Pulse 72  Temp(Src) 98.6 F (37 C) (Oral)  Resp 16  SpO2 100%  Physical Exam  Constitutional: He appears well-developed and well-nourished. No distress.  Cardiovascular: Normal rate, regular rhythm, normal heart sounds and intact distal pulses.  Exam reveals no gallop and no friction rub.   No  murmur heard. Pulmonary/Chest: Effort normal and breath sounds normal. No respiratory distress. He has no wheezes. He has no rales. He exhibits no tenderness.  Musculoskeletal: Normal range of motion. He exhibits tenderness. He exhibits no edema.  Neurological: He is alert.  Skin: Skin is warm and dry. No rash noted. He is not diaphoretic. No erythema. There is pallor.  The patient has an approximately 2 cm fluctuant mass on the ball of his left foot. Patient states that originally the wound drained however has not had any drainage or pus since that time. There is no erythema however tenderness is present.      ED Course  INCISION AND DRAINAGE Date/Time: 02/04/2014 11:16 AM Performed by: Weber CooksOSSI, Kemarion Abbey Authorized by: Weber CooksOSSI, Jatziri Goffredo Consent: Verbal consent obtained. Risks and benefits: risks, benefits and alternatives were discussed Consent given by: patient Patient understanding: patient states understanding of the procedure being performed Imaging studies: imaging studies available Required items: required blood products, implants, devices, and special equipment available Patient identity confirmed: verbally with patient and arm band Type: abscess Location: Bottom of Left Foot. Anesthesia: local infiltration Local anesthetic: lidocaine 2% without epinephrine Anesthetic total: 5 ml Patient sedated: no Scalpel size: 11 Needle gauge: 27 g. Incision type: single straight Complexity: simple Drainage: purulent Drainage amount: moderate Packing material: 1/4 in iodoform gauze Patient tolerance: Patient tolerated the procedure well with no immediate complications. Comments: Irrigated with approximately 60cc of saline til clear.  No evidence of foreign debris.  Thick callous over abscess evaluated by Dr. Denyse Amassorey for presence of non-radiopaque  foreign body.   Color, movement, and distal sensation intact,  Cap refill less than 3 seconds pre-and post procedure.    (including critical  care time)   Labs Review Labs Reviewed - No data to display  Imaging Review Dg Foot Complete Left  02/04/2014   CLINICAL DATA:  Puncture wound.  EXAM: LEFT FOOT - COMPLETE 3+ VIEW  COMPARISON:  None.  FINDINGS: Soft tissue swelling on the plantar surface of the foot. There is no evidence of fracture or dislocation. No focal bony lesions. Radiopacity noted along the posterior ankle on oblique view. This may represent external debris . This is not identified on lateral view. No radiopaque foreign bodies in the foot.  IMPRESSION: No acute bony abnormality. Soft tissue swelling noted of the plantar aspect of the foot. No radiopaque foreign bodies.   Electronically Signed   By: Maisie Fushomas  Register   On: 02/04/2014 10:03     MDM   1. Abscess of left foot    Meds ordered this encounter  Medications  . Efavirenz-Emtricitab-Tenofovir (ATRIPLA PO)    Sig: Take by mouth.  . DOXYCYCLINE HYCLATE PO    Sig: Take by mouth.  . doxycycline (VIBRAMYCIN) 100 MG capsule    Sig: Take 1 capsule (100 mg total) by mouth 2 (two) times daily.    Dispense:  14 capsule    Refill:  0  . cephALEXin (KEFLEX) 250 MG capsule    Sig: Take 1 capsule (250 mg total) by mouth 4 (four) times daily.    Dispense:  28 capsule    Refill:  0  . HYDROcodone-acetaminophen (NORCO/VICODIN) 5-325 MG per tablet    Sig: Take 1-2 tablets by mouth every 4 (four) hours as needed.    Dispense:  15 tablet    Refill:  0    Consulted with Dr. Denyse Amassorey on plan of care.  The patient to return for packing removal in approximately 3-4 days The patient verbalizes understanding and agrees to plan of care.       Weber Cooksatherine Jillyn Stacey, NP 02/04/14 1123

## 2014-02-04 NOTE — ED Notes (Signed)
Pt     Reports  He  Stepped  On  A  Nail  Several  Months  Ago   And  Developed  Pain  And  Swelling of  The  Bottom  Of    His        Foot         It  Got  Better  And then became  Worse  He  Has  Been taking  Leftover  Doxy        And  His  Tet shot is  utd

## 2014-02-04 NOTE — ED Notes (Signed)
Provider   c  rossi    Stuck  By  Dirty  Needle       While  Numbing      The  Affected  Foot  protocall     For  Post  Exposure  Initiated

## 2014-02-04 NOTE — Discharge Instructions (Signed)
Abscess An abscess is an infected area that contains a collection of pus and debris.It can occur in almost any part of the body. An abscess is also known as a furuncle or boil. CAUSES  An abscess occurs when tissue gets infected. This can occur from blockage of oil or sweat glands, infection of hair follicles, or a minor injury to the skin. As the body tries to fight the infection, pus collects in the area and creates pressure under the skin. This pressure causes pain. People with weakened immune systems have difficulty fighting infections and get certain abscesses more often.  SYMPTOMS Usually an abscess develops on the skin and becomes a painful mass that is red, warm, and tender. If the abscess forms under the skin, you may feel a moveable soft area under the skin. Some abscesses break open (rupture) on their own, but most will continue to get worse without care. The infection can spread deeper into the body and eventually into the bloodstream, causing you to feel ill.  DIAGNOSIS  Your caregiver will take your medical history and perform a physical exam. A sample of fluid may also be taken from the abscess to determine what is causing your infection. TREATMENT  Your caregiver may prescribe antibiotic medicines to fight the infection. However, taking antibiotics alone usually does not cure an abscess. Your caregiver may need to make a small cut (incision) in the abscess to drain the pus. In some cases, gauze is packed into the abscess to reduce pain and to continue draining the area. HOME CARE INSTRUCTIONS   Only take over-the-counter or prescription medicines for pain, discomfort, or fever as directed by your caregiver.  If you were prescribed antibiotics, take them as directed. Finish them even if you start to feel better.  If gauze is used, follow your caregiver's directions for changing the gauze.  To avoid spreading the infection:  Keep your draining abscess covered with a  bandage.  Wash your hands well.  Do not share personal care items, towels, or whirlpools with others.  Avoid skin contact with others.  Keep your skin and clothes clean around the abscess.  Keep all follow-up appointments as directed by your caregiver. SEEK MEDICAL CARE IF:   You have increased pain, swelling, redness, fluid drainage, or bleeding.  You have muscle aches, chills, or a general ill feeling.  You have a fever. MAKE SURE YOU:   Understand these instructions.  Will watch your condition.  Will get help right away if you are not doing well or get worse. Document Released: 07/01/2005 Document Revised: 03/22/2012 Document Reviewed: 12/04/2011 [employer] Patient Information 2014 [employer].  

## 2014-02-04 NOTE — ED Notes (Addendum)
Bloods  Drawn  By  Yvone Neuim   forsyth   For  Post  Exposure    protocall       Need  1  More  Tube     Pt  Notified  States  He  Will  Return in  Am  To  Have  The  Tube  drawn

## 2014-02-05 LAB — CD4/CD8 (T-HELPER/T-SUPPRESSOR CELL)
CD4 absolute: 320 /uL — ABNORMAL LOW (ref 500–1900)
CD4%: 17 % — ABNORMAL LOW (ref 30.0–60.0)
CD8 T Cell Abs: 330 /uL (ref 230–1000)
CD8tox: 17 % (ref 15.0–40.0)
Ratio: 1 (ref 1.0–3.0)
TOTAL LYMPHOCYTE COUNT: 1880 /uL (ref 1000–4000)

## 2014-02-06 LAB — WOUND CULTURE

## 2014-02-06 NOTE — ED Notes (Signed)
Pt   Arrived  To  Have  Blood  Drawn    And  Have  His  Foot  Rechecked        While  He  Was  Waiting  To  Register     He  Stated  He  Had  A  Phone  Call  And  Had  To  Leave     Pt  Was  [employer] AmericanCalled  Said he  Will  Return tommorow

## 2014-02-06 NOTE — ED Notes (Addendum)
Wound culture: Mod. Group B strep (S. Agalactiae).  Pt. treated with I and D, Keflex and Doxycycline.  CD 4 320 L, CD4% 17 L.   Message sent to Artemio AlyKatherine Rossi NP. Anthony MoselleSuzanne M Burch Parrish 02/06/2014 Dr. Lorenz CoasterKeller rechecked pt. Today.  He saw the culture report and said treatment is adequate.  He discussed  CD 4 results with pt. Anthony LucySuzanne M Maverick Parrish 02/07/2014

## 2014-02-07 ENCOUNTER — Emergency Department (INDEPENDENT_AMBULATORY_CARE_PROVIDER_SITE_OTHER)
Admission: EM | Admit: 2014-02-07 | Discharge: 2014-02-07 | Disposition: A | Discharge status: Home / Self Care | Payer: 59 | Source: Home / Self Care | Attending: Emergency Medicine | Admitting: Emergency Medicine

## 2014-02-07 ENCOUNTER — Encounter (HOSPITAL_COMMUNITY): Payer: Self-pay | Admitting: Emergency Medicine

## 2014-02-07 DIAGNOSIS — L0390 Cellulitis, unspecified: Secondary | ICD-10-CM

## 2014-02-07 DIAGNOSIS — L0291 Cutaneous abscess, unspecified: Secondary | ICD-10-CM

## 2014-02-07 NOTE — ED Provider Notes (Signed)
Medical screening examination/treatment/procedure(s) were performed by a resident physician or non-physician practitioner and as the supervising physician I was immediately available for consultation/collaboration.  Clementeen GrahamEvan Jatavius Ellenwood, MD    Rodolph BongEvan S Shellie Rogoff, MD 02/07/14 [phone]

## 2014-02-07 NOTE — ED Notes (Signed)
Wound check and return visit for viral load as source patient for needle stick injury

## 2014-02-07 NOTE — Discharge Instructions (Signed)
Now that the packing has been removed from your abscess, you may bathe and shower as normal.  You should change the dressing at least once a day.  You may change it more often if it becomes soiled with drainage.  Wash the wound well with soap and water, pat dry, apply an antibiotic ointment (Neosporin, Polysporin, or Bacitracin are all OK), then cover with a non-stick dressing such as Telfa with several layers of plain gause over that to absorb drainage.  You may secure this in place with tape or with roll gauze and tape.  Keep the wound covered for until it stops draining.  This usually takes 7 days, but may be longer.  Return for a recheck if you have heavy bleeding, increasing pain, fever, or the wound looks worse.  ° °

## 2014-02-07 NOTE — ED Provider Notes (Signed)
  Chief Complaint   Chief Complaint  Patient presents with  . Wound Check    History of Present Illness   Anthony Parrish is a 33 year old HIV-positive male who returns for followup on abscess on the bottom of his left foot. This occurred approximately 2 months after stepping on a rusty hammer. He's gotten a tetanus shot. The callused area was opened up and a small drop of pus was drained out. This was cultured. The culture eventually grew out group B strep. He was placed on doxycycline and cephalexin. He feels that the lesion is healing up. It's less swollen and painful. He denies any fever or chills. The packing has come out on its own.  Review of Systems   Other than as noted above, the patient denies any of the following symptoms: Systemic:  No fever, chills or sweats. Skin:  No rash or itching.  PMFSH   Past medical history, family history, social history, meds, and allergies were reviewed. He is HIV positive and takes Atripla.  Physical Examination     Vital signs:  BP 135/82  Pulse 73  Temp(Src) 98.7 F (37.1 C) (Oral)  Resp 18  SpO2 100% Skin:  Exam of the left foot reveals the wound to be healing up well. There still some skin was tissue present. There is no drainage. No induration, no erythema and minimal tenderness to palpation.  There was no crepitus, necrosis, ecchymosis, or herrhagic bullae. Skin exam was otherwise normal.  No rash. Ext:  Distal pulses were full, patient has full ROM of all joints.  Course in Urgent Care Center   The wound was cleansed with saline, antibiotic ointment was applied and a sterile dressing.  Assessment   The encounter diagnosis was Abscess.  Cultures growing out group B strep in. He's on doxycycline and cephalexin and have him continue with these until they are gone. He was instructed in wound care.  Plan     1.  Meds:  The following meds were prescribed:   New Prescriptions   No medications on file    2.  Patient  Education/Counseling:  The patient was given appropriate handouts, self care instructions, and instructed in symptomatic relief.    3.  Follow up:  The patient was instructed to return if there is any sign of worsening infection.   Reuben Likesavid C Mcclellan Demarais, MD 02/07/14 [phone]

## 2015-01-02 ENCOUNTER — Telehealth: Payer: Self-pay

## 2015-01-02 NOTE — Telephone Encounter (Signed)
Patient contacted regarding new intake appointment. Date and time given. Information given regarding documents needed to qualify for financial eligibility.  Iyanna Drummer K Shawn Carattini, RN  

## 2015-01-15 ENCOUNTER — Ambulatory Visit: Payer: Self-pay

## 2015-01-31 IMAGING — CR DG FOOT COMPLETE 3+V*L*
3 series · 3 of 3 positions shown · non-contrast
Comparison: None.

CLINICAL DATA: Puncture wound.

EXAM:
LEFT FOOT - COMPLETE 3+ VIEW

[view not recorded (1 of 3)]
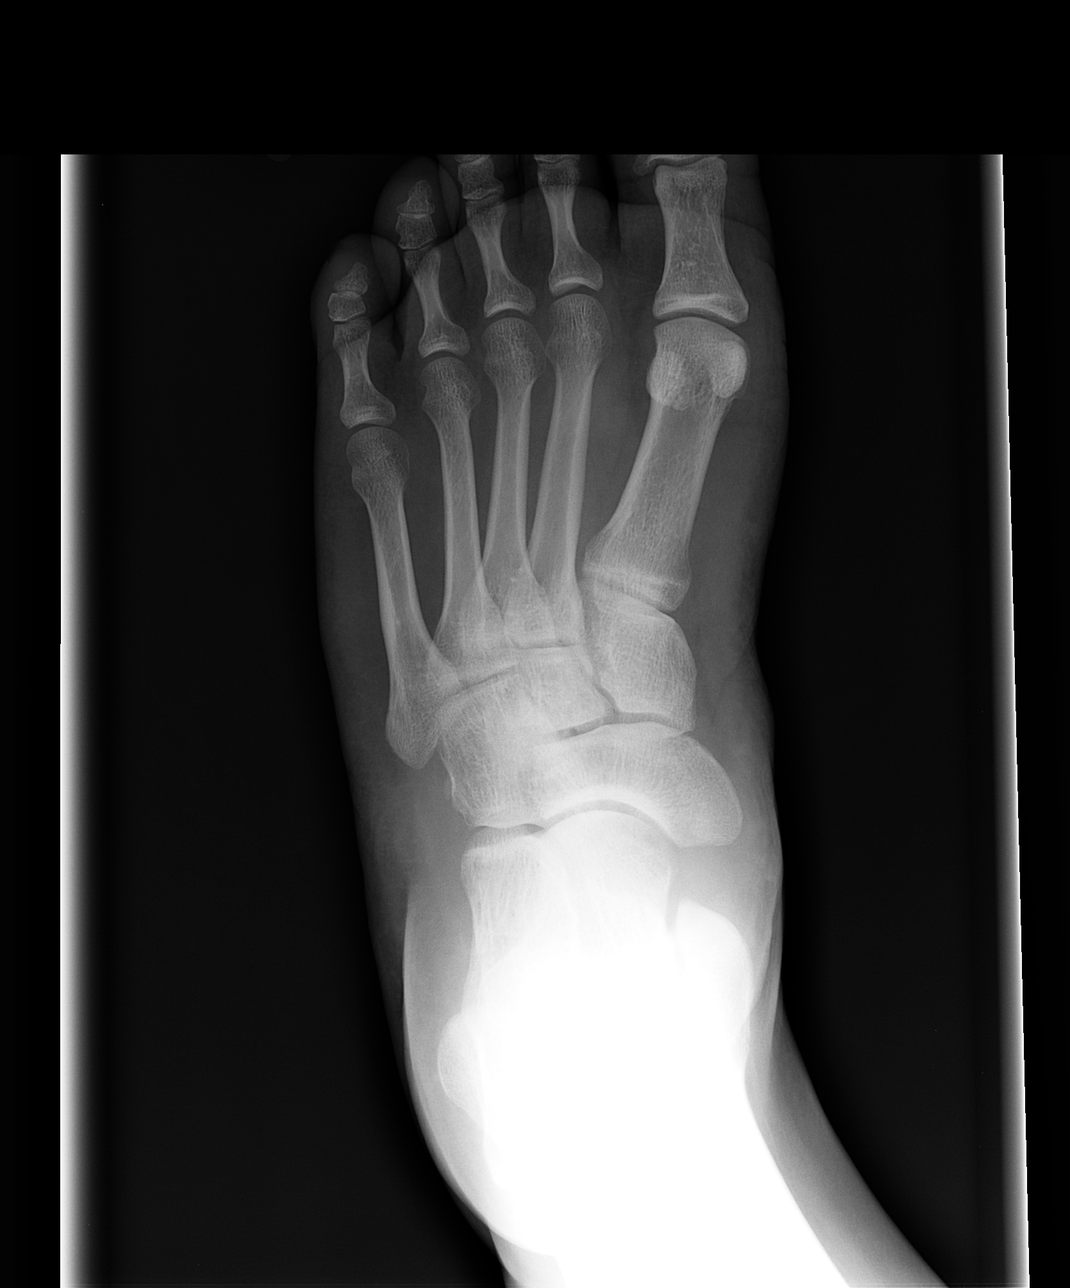

[view not recorded (2 of 3)]
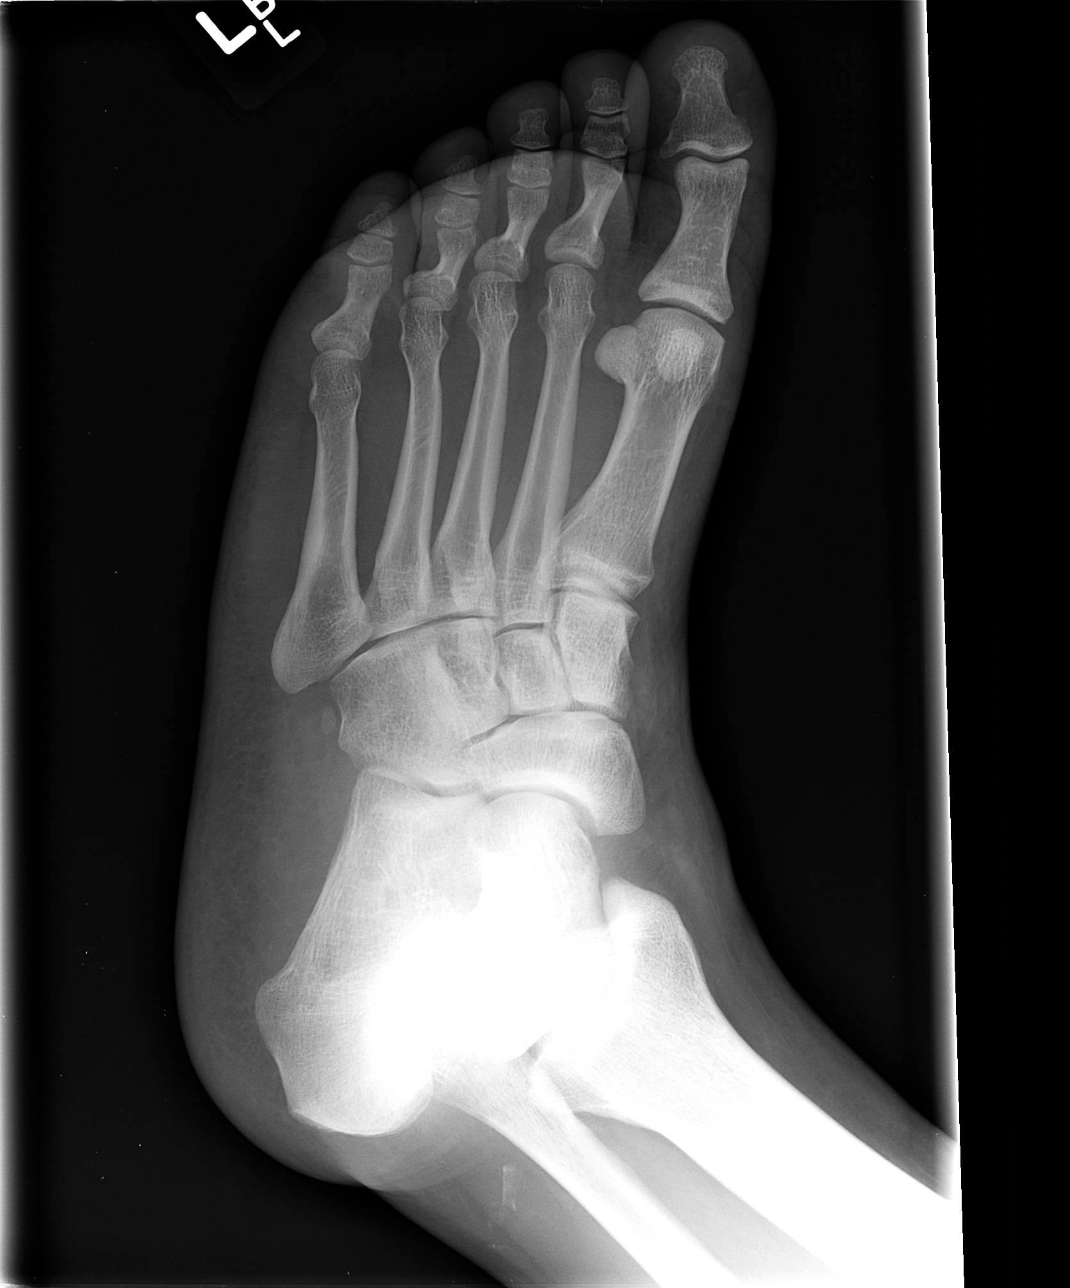

[view not recorded (3 of 3)]
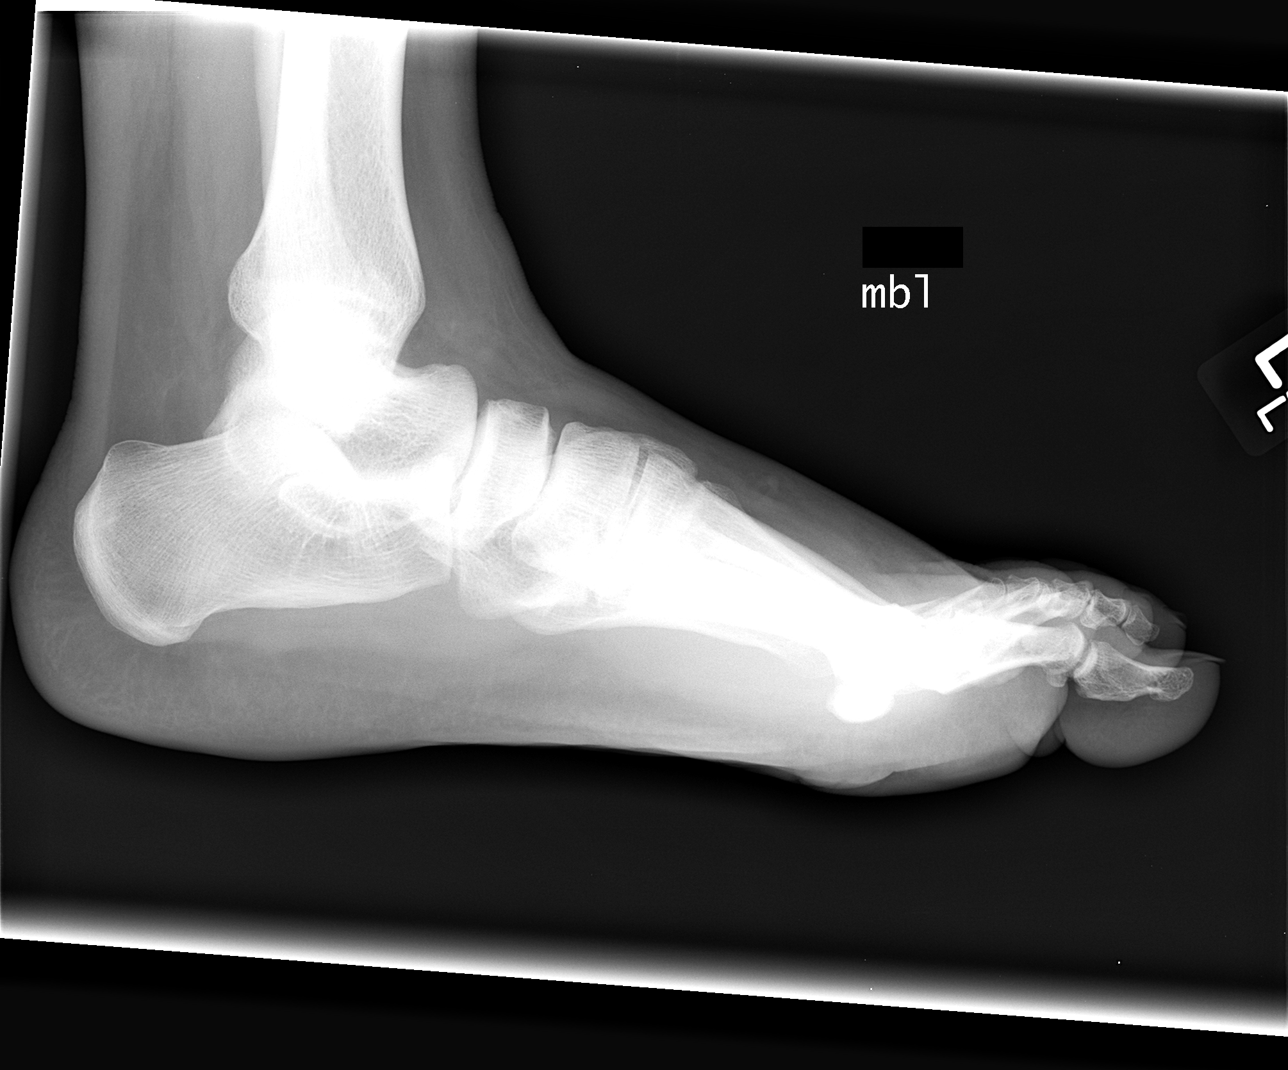

[3 of 3 positions shown; findings below may reference images not displayed]

FINDINGS: Soft tissue swelling on the plantar surface of the foot. There is no
evidence of fracture or dislocation. No focal bony lesions.
Radiopacity noted along the posterior ankle on oblique view. This
may represent external debris . This is not identified on lateral
view. No radiopaque foreign bodies in the foot.
IMPRESSION: No acute bony abnormality. Soft tissue swelling noted of the plantar
aspect of the foot. No radiopaque foreign bodies.

## 2015-02-28 ENCOUNTER — Telehealth: Payer: Self-pay | Admitting: *Deleted

## 2015-02-28 NOTE — Telephone Encounter (Signed)
Patient called to schedule a physician's appointment.  He states he needs to reschedule from April.  Patient no-showed his [facility] intake with Tammy in April.  Transferred to her voicemail to request to be placed on her schedule. Andree CossHowell, Tamantha Saline M, RN
